# Patient Record
Sex: Male | Born: 1995 | Race: White | Hispanic: No | Marital: Single | Smoking: Never smoker
Health system: Southern US, Community
[De-identification: ages and names within clinical notes are randomized; demographics above are authoritative.]

---
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
## 2015-11-29 ENCOUNTER — Ambulatory Visit (INDEPENDENT_AMBULATORY_CARE_PROVIDER_SITE_OTHER): Payer: PRIVATE HEALTH INSURANCE

## 2015-11-29 ENCOUNTER — Other Ambulatory Visit: Payer: Self-pay | Admitting: Student

## 2015-11-29 ENCOUNTER — Ambulatory Visit (INDEPENDENT_AMBULATORY_CARE_PROVIDER_SITE_OTHER): Payer: PRIVATE HEALTH INSURANCE | Admitting: Student

## 2015-11-29 DIAGNOSIS — S0992XA Unspecified injury of nose, initial encounter: Secondary | ICD-10-CM

## 2015-11-29 NOTE — Assessment & Plan Note (Addendum)
Bleeding controlled currently, no evidence of septal hematoma seen on exam.  Facial x-rays show no evidence of fracture. Concern for nasal cartilage disruption. Spoke with ENT who recommended seeing him in the office tomorrow for evaluation.  ATC for [employer] notified.  Precautions for septal hematoma given.

## 2015-11-29 NOTE — Progress Notes (Signed)
  Ryan Conrad - 20 y.o. male MRN [id number]  Date of birth: [birth year]  SUBJECTIVE:  Including CC & ROS.  CC: Nasal injury Presents after nasal injury during football practice.  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  His teammate pushed his facemask, which popped off and went into his nose.  Felt a pop and immediate bleeding.  He states that his nose looks different than pictures.  Congested on right nare, not so much on left.  Feels deviation to left.  Mild pain along maxillary bones.  No visual issues, headaches, but does feel lightheaded.  Denies nausea, vomiting, SOB, CP.    Would like it fixed if it is broken.    ROS: No unexpected weight loss, fever, chills, swelling, instability, muscle pain, numbness/tingling, redness, otherwise see HPI   PMHx - Updated and reviewed.  Contributory factors include: Negative PSHx - Updated and reviewed.  Contributory factors include:  Negative FHx - Updated and reviewed.  Contributory factors include:  Negative Social Hx - Updated and reviewed. Contributory factors include: Negative, no smoking, alcohol use, [employer] player Medications - reviewed  Allergic to PCN, seasonal  DATA REVIEWED: Facial bone x-rays show no fracture  PHYSICAL EXAM:  VS: BP:108/66  HR:96bpm  TEMP:98 F (36.7 C)(Oral)  RESP:97 %  HT:    WT:223 lb (101.2 kg)  BMI:  PHYSICAL EXAM: Gen: NAD, alert, cooperative with exam, well-appearing, congested on exam HEENT: clear conjunctiva, swelling over nasal bridge with TTP.  Has pain and step off on nasal cartilage.  Small amount of bleeding in right nare, no bluish hue seen on mucosa bilaterally.  Left nare with little blood.  No maxillary tenderness.  No blood in oropharynx.  No dental injury or bleeding from gums.  Congested right nare. Eye: EOMI, PERRLA CV:  no edema, capillary refill brisk, normal rate Resp: non-labored Skin: no rashes, normal turgor  Neuro: no gross deficits.  Psych:   alert and oriented   ASSESSMENT & PLAN:   Nasal injury, initial encounter Bleeding controlled currently, no evidence of septal hematoma seen on exam.  Facial x-rays show no evidence of fracture. Concern for nasal cartilage disruption. Spoke with ENT who recommended seeing him in the office tomorrow for evaluation.  [facility]C for [employer] notified.  Precautions for septal hematoma given.

## 2015-11-29 NOTE — Patient Instructions (Addendum)
11/30/15  11:15AM Ear, Nose, and Throat- Dr. Ezzard StandingNewman [address], [place], [place] [id number], [place] [id number]

## 2016-11-14 ENCOUNTER — Ambulatory Visit (INDEPENDENT_AMBULATORY_CARE_PROVIDER_SITE_OTHER): Payer: PRIVATE HEALTH INSURANCE | Admitting: Family Medicine

## 2016-11-14 ENCOUNTER — Encounter: Payer: Self-pay | Admitting: Family Medicine

## 2016-11-14 DIAGNOSIS — M7050 Other bursitis of knee, unspecified knee: Secondary | ICD-10-CM | POA: Insufficient documentation

## 2016-11-14 MED ORDER — METHYLPREDNISOLONE ACETATE 40 MG/ML IJ SUSP
40.0000 mg | Freq: Once | INTRAMUSCULAR | Status: AC
  Administered 2016-11-14: 40 mg via INTRA_ARTICULAR

## 2016-11-14 NOTE — Assessment & Plan Note (Signed)
Patient is presenting with signs and symptoms consistent with pes anserine bursitis. Diagnosis supported with ultrasound findings. - Ultrasound-guided pes anserine bursal injection provided today. - Compression with knee sleeve - OTC NSAIDs - Ice 3 times a day - We'll follow-up with patient in training room.

## 2016-11-14 NOTE — Progress Notes (Signed)
HPI  CC: Right leg pain Patient is here today with complaints of right knee and leg pain. He was seen by his athletic trainer after noticeable swelling, pain, and ecchymosis along the anterior aspect of his right knee. He denies any specific known injury. No popping, locking, or catching. He states he has not had any specific knee swelling but the tissue just medial to and distal to the knee has been swollen and painful. Patient is a offense Copywriter, advertising for [employer].  He denies any feelings of knee instability. No weakness, numbness, or paresthesias.  Medications/Interventions Tried: Ibuprofen  See HPI and/or previous note for associated ROS.  Objective: BP 123/70   Ht  (1.88 m)   Wt 230 lb (104.3 kg)   BMI 29.53 kg/m  Gen: NAD, well groomed, a/o x3, normal affect.  CV: Well-perfused. Warm.  Resp: Non-labored.  Neuro: Sensation intact throughout. No gross coordination deficits.  Gait: Nonpathologic posture, unremarkable stride without signs of limp or balance issues. Knee, Right: TTP noted at the medial aspect just distal to the JL. Inspection was POSITIVE for swelling and erythema along this area. No joint erythema, ecchymosis, or effusion. No obvious bony abnormalities or signs of osteophyte development. Palpation yielded no asymmetric warmth; No joint line tenderness; No condyle tenderness; No patellar tenderness; No patellar crepitus. Patellar and quadriceps tendons unremarkable. Significant tenderness of the pes anserine bursa. No obvious Baker's cyst development. ROM normal in flexion (135 degrees) and extension (0 degrees). Normal hamstring and quadriceps strength. Neurovascularly intact bilaterally.  - Ligaments: (Solid and consistent endpoints)   - ACL (present bilaterally)   - PCL (present bilaterally)   - LCL (present bilaterally)   - MCL (present bilaterally).   - Meniscus:   - Thessaly: NEG  - Patella:   - Patellar grind/compression: NEG   - Patellar glide:  Without apprehension  ULTRASOUND: Knee, Right Diagnostic limited ultrasound imaging obtained of patient's right knee.   - Patellar tendon: No appreciated signs of tearing, edema, or calcification. No infrapatellar or tibial tuberosity fluid or abnormality appreciated.  - Medial joint line: No signs concerning for meniscal pathology appreciated. No increased fluid presence noted. No evidence of osteophyte development or significant joint space loss.  - Popliteal fossa: No evidence of Baker's cyst formation. Vasculature with compressible popliteal vein, without evidence of venous distention. Diameter similar in comparison to left side.  - MCL: No evidence of integrity loss or abnormal fluid presence.  - LCL: No evidence of integrity loss or abnormal fluid presence.  - Pes Anserine: Substantial evidence of abnormal fluid presence. Fatty edema and increased vascular flow present. No evidence of periosteal involvement. IMPRESSION: findings consistent with post-traumatic pes anserine bursitis   INJECTION: Right pes anserine bursa -- ultrasound-guided Patient was given informed consent, signed copy in the chart. Appropriate time out was taken. Right pes anserine bursa was visualized under ultrasound and needle placement was marked. Area prepped and draped in usual sterile fashion. Needle placement confirmed under ultrasound prior to steroid injection. 1 cc of methylprednisolone 40 mg/ml plus 1 of 1% lidocaine without epinephrine was injected into the has anserine bursa using a(n) superior lateral approach. The patient tolerated the procedure well. There were no complications. Post procedure instructions were given.   Assessment and plan:  Pes anserine bursitis Patient is presenting with signs and symptoms consistent with pes anserine bursitis. Diagnosis supported with ultrasound findings. - Ultrasound-guided pes anserine bursal injection provided today. - Compression with knee sleeve - OTC NSAIDs -  Ice 3 times a day - We'll follow-up with patient in training room.   Meds ordered this encounter  Medications  . methylPREDNISolone acetate (DEPO-MEDROL) injection 40 mg     Kathee Delton, MD,MS [facility] Fellow 11/14/2016 4:49 PM

## 2018-04-20 IMAGING — DX DG NASAL BONES 3+V
3 series · 3 of 3 positions shown · non-contrast
Comparison: None.

CLINICAL DATA: Nasal injury.  Initial encounter.

EXAM:
NASAL BONES - 3+ VIEW

[[person_name]]
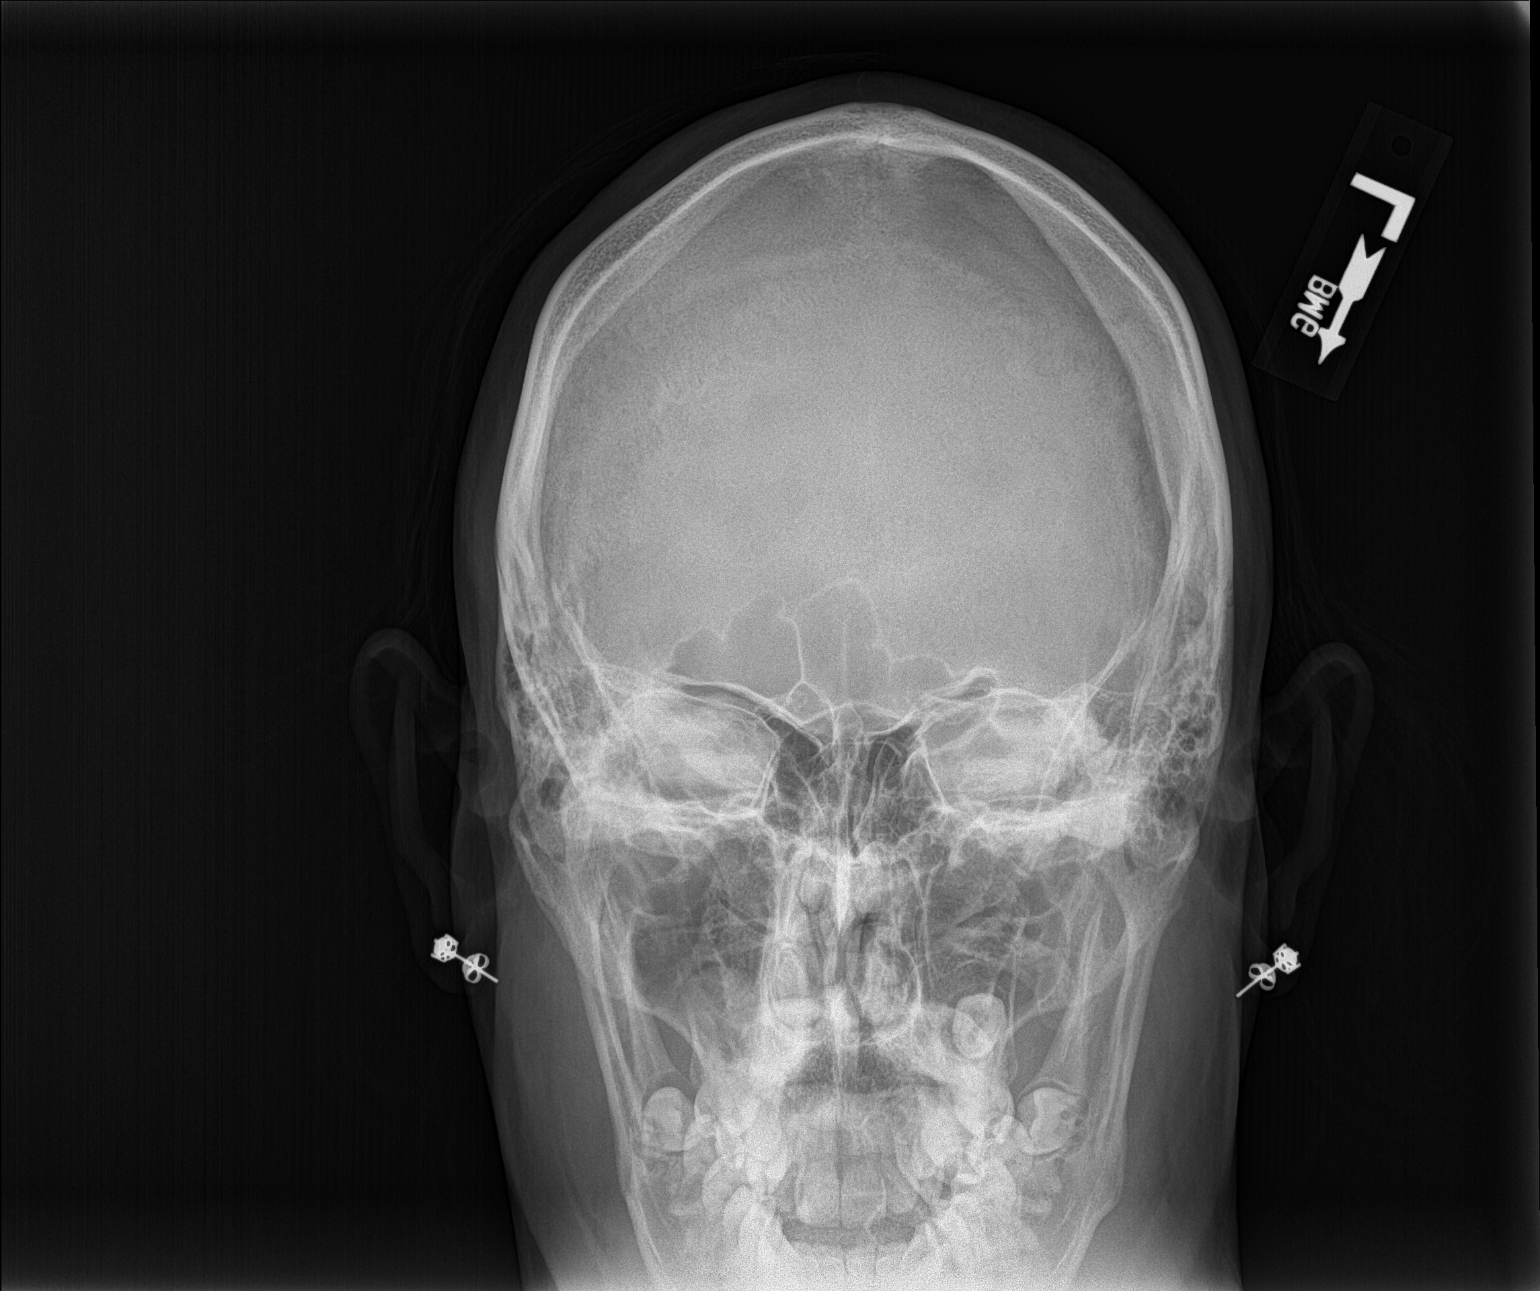

[nasal lat (1 of 2)]
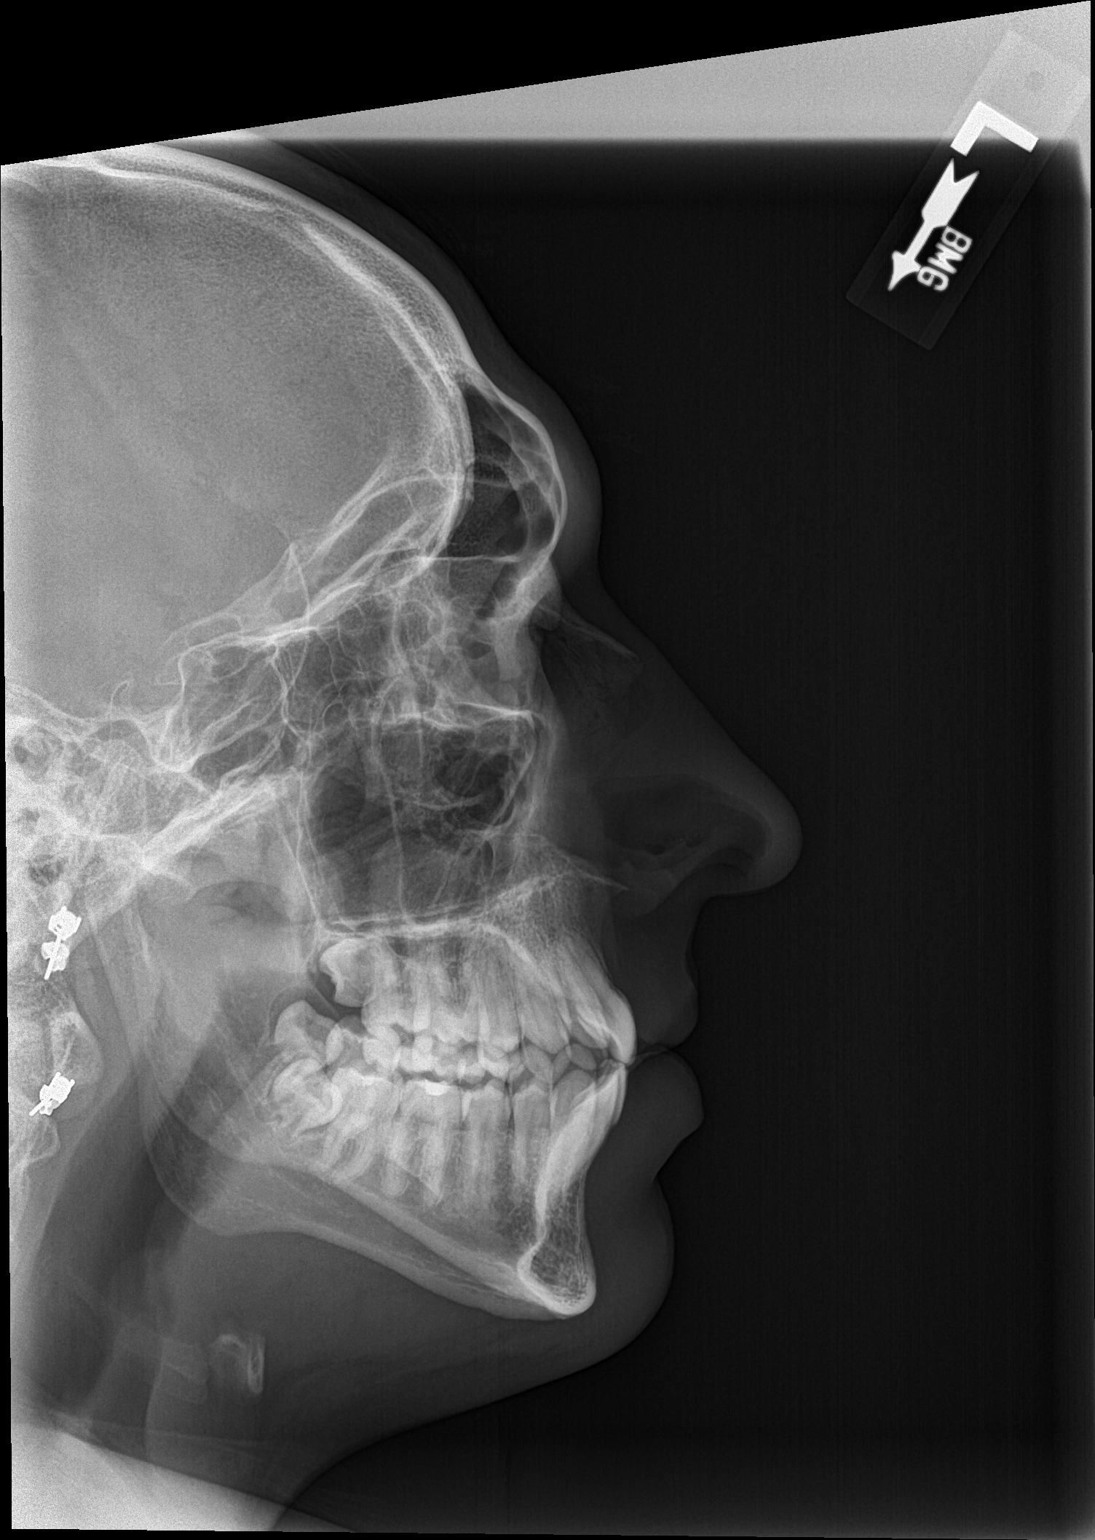

[nasal lat (2 of 2)]
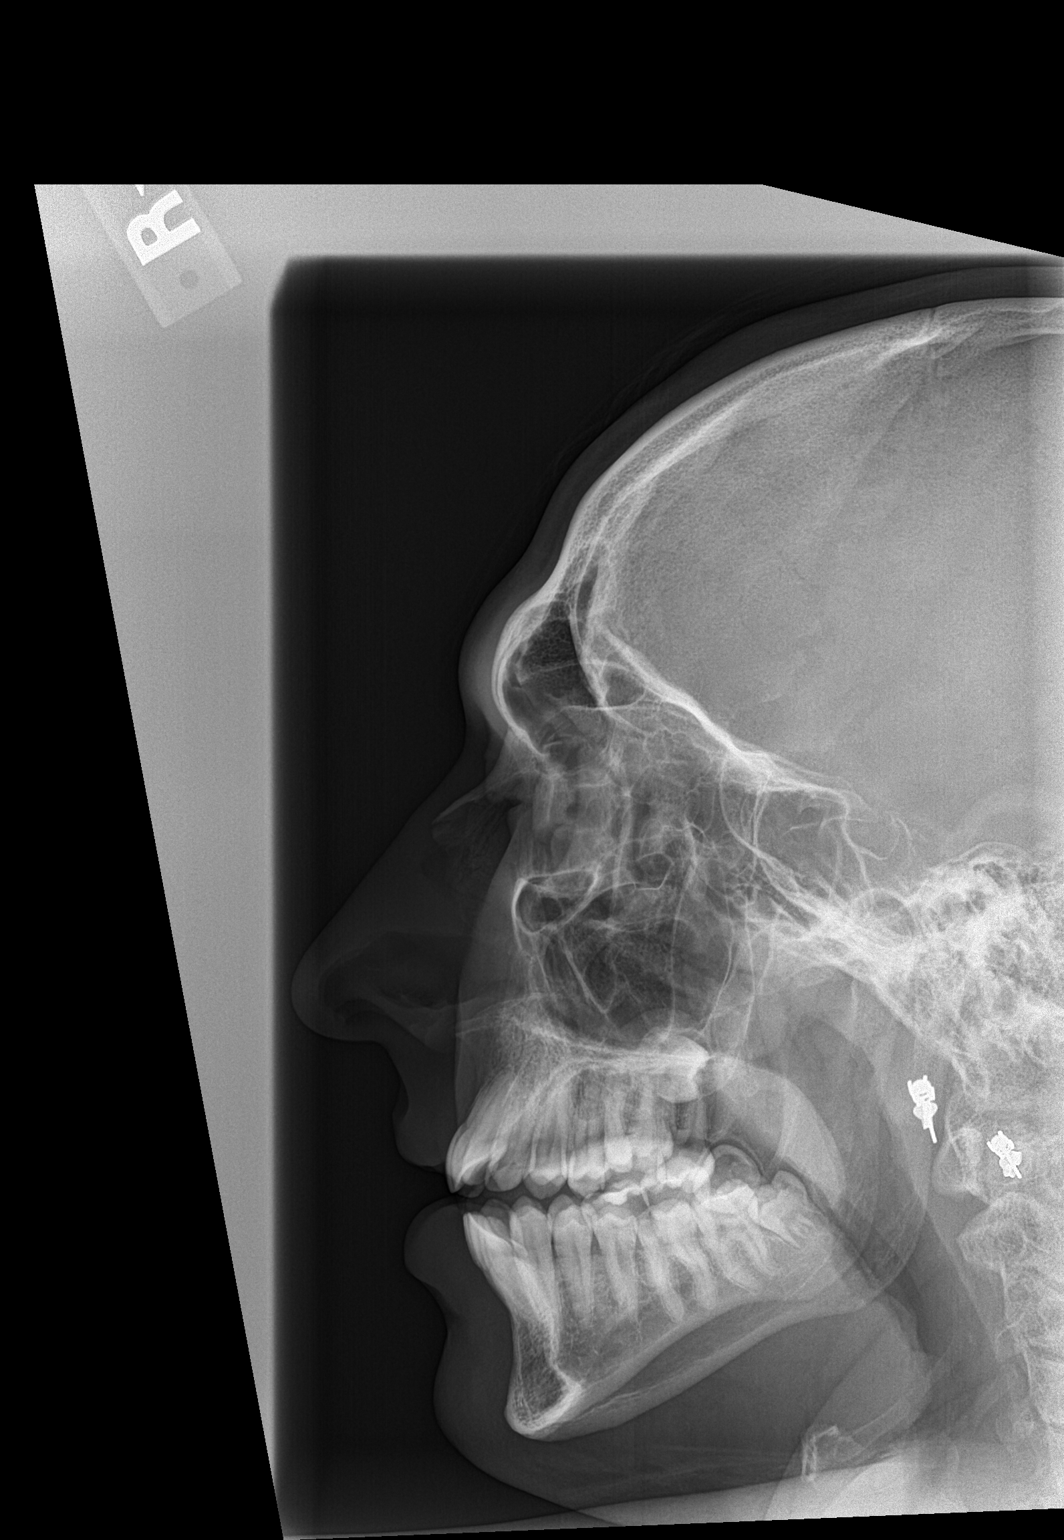

[3 of 3 positions shown; findings below may reference images not displayed]

FINDINGS: There is no evidence of fracture or other bone abnormality.
IMPRESSION: Negative.
